# Patient Record
Sex: Female | Born: 1950 | Race: White | Hispanic: No
Health system: Southern US, Community
[De-identification: ages and names within clinical notes are randomized; demographics above are authoritative.]

---

## 2016-01-16 DIAGNOSIS — M5441 Lumbago with sciatica, right side: Secondary | ICD-10-CM | POA: Diagnosis not present

## 2016-01-16 DIAGNOSIS — M5126 Other intervertebral disc displacement, lumbar region: Secondary | ICD-10-CM | POA: Diagnosis not present

## 2016-01-16 DIAGNOSIS — M5117 Intervertebral disc disorders with radiculopathy, lumbosacral region: Secondary | ICD-10-CM | POA: Diagnosis not present

## 2016-01-26 DIAGNOSIS — M5416 Radiculopathy, lumbar region: Secondary | ICD-10-CM | POA: Diagnosis not present

## 2016-01-26 DIAGNOSIS — M461 Sacroiliitis, not elsewhere classified: Secondary | ICD-10-CM | POA: Diagnosis not present

## 2016-01-26 DIAGNOSIS — M5116 Intervertebral disc disorders with radiculopathy, lumbar region: Secondary | ICD-10-CM | POA: Diagnosis not present

## 2016-01-26 DIAGNOSIS — M48062 Spinal stenosis, lumbar region with neurogenic claudication: Secondary | ICD-10-CM | POA: Diagnosis not present

## 2016-01-26 DIAGNOSIS — T7840XA Allergy, unspecified, initial encounter: Secondary | ICD-10-CM | POA: Diagnosis not present

## 2016-01-26 DIAGNOSIS — M48061 Spinal stenosis, lumbar region without neurogenic claudication: Secondary | ICD-10-CM | POA: Diagnosis not present

## 2016-02-04 DIAGNOSIS — F332 Major depressive disorder, recurrent severe without psychotic features: Secondary | ICD-10-CM | POA: Diagnosis not present

## 2016-02-04 DIAGNOSIS — F411 Generalized anxiety disorder: Secondary | ICD-10-CM | POA: Diagnosis not present

## 2016-02-13 DIAGNOSIS — M5441 Lumbago with sciatica, right side: Secondary | ICD-10-CM | POA: Diagnosis not present

## 2016-02-13 DIAGNOSIS — M5116 Intervertebral disc disorders with radiculopathy, lumbar region: Secondary | ICD-10-CM | POA: Diagnosis not present

## 2016-02-13 DIAGNOSIS — M5126 Other intervertebral disc displacement, lumbar region: Secondary | ICD-10-CM | POA: Diagnosis not present

## 2016-03-01 DIAGNOSIS — M461 Sacroiliitis, not elsewhere classified: Secondary | ICD-10-CM | POA: Diagnosis not present

## 2016-03-01 DIAGNOSIS — M5126 Other intervertebral disc displacement, lumbar region: Secondary | ICD-10-CM | POA: Diagnosis not present

## 2016-03-01 DIAGNOSIS — M5416 Radiculopathy, lumbar region: Secondary | ICD-10-CM | POA: Diagnosis not present

## 2016-03-01 DIAGNOSIS — M5116 Intervertebral disc disorders with radiculopathy, lumbar region: Secondary | ICD-10-CM | POA: Diagnosis not present

## 2016-03-11 DIAGNOSIS — M5116 Intervertebral disc disorders with radiculopathy, lumbar region: Secondary | ICD-10-CM | POA: Diagnosis not present

## 2016-03-11 DIAGNOSIS — M4727 Other spondylosis with radiculopathy, lumbosacral region: Secondary | ICD-10-CM | POA: Diagnosis not present

## 2016-03-23 DIAGNOSIS — M5126 Other intervertebral disc displacement, lumbar region: Secondary | ICD-10-CM | POA: Diagnosis not present

## 2016-03-23 DIAGNOSIS — M48061 Spinal stenosis, lumbar region without neurogenic claudication: Secondary | ICD-10-CM | POA: Diagnosis not present

## 2016-03-30 DIAGNOSIS — E559 Vitamin D deficiency, unspecified: Secondary | ICD-10-CM | POA: Diagnosis not present

## 2016-03-30 DIAGNOSIS — M2550 Pain in unspecified joint: Secondary | ICD-10-CM | POA: Diagnosis not present

## 2016-03-30 DIAGNOSIS — E039 Hypothyroidism, unspecified: Secondary | ICD-10-CM | POA: Diagnosis not present

## 2016-03-30 DIAGNOSIS — K900 Celiac disease: Secondary | ICD-10-CM | POA: Diagnosis not present

## 2016-03-30 DIAGNOSIS — M62838 Other muscle spasm: Secondary | ICD-10-CM | POA: Diagnosis not present

## 2016-03-30 DIAGNOSIS — E7800 Pure hypercholesterolemia, unspecified: Secondary | ICD-10-CM | POA: Diagnosis not present

## 2016-03-30 DIAGNOSIS — M0579 Rheumatoid arthritis with rheumatoid factor of multiple sites without organ or systems involvement: Secondary | ICD-10-CM | POA: Diagnosis not present

## 2016-03-30 DIAGNOSIS — Z13820 Encounter for screening for osteoporosis: Secondary | ICD-10-CM | POA: Diagnosis not present

## 2016-03-30 DIAGNOSIS — R7309 Other abnormal glucose: Secondary | ICD-10-CM | POA: Diagnosis not present

## 2016-03-30 DIAGNOSIS — L4059 Other psoriatic arthropathy: Secondary | ICD-10-CM | POA: Diagnosis not present

## 2016-04-07 DIAGNOSIS — M069 Rheumatoid arthritis, unspecified: Secondary | ICD-10-CM | POA: Diagnosis not present

## 2016-04-07 DIAGNOSIS — M48061 Spinal stenosis, lumbar region without neurogenic claudication: Secondary | ICD-10-CM | POA: Diagnosis not present

## 2016-04-07 DIAGNOSIS — Z01818 Encounter for other preprocedural examination: Secondary | ICD-10-CM | POA: Diagnosis not present

## 2016-04-07 DIAGNOSIS — M5136 Other intervertebral disc degeneration, lumbar region: Secondary | ICD-10-CM | POA: Diagnosis not present

## 2016-04-13 DIAGNOSIS — M4306 Spondylolysis, lumbar region: Secondary | ICD-10-CM | POA: Diagnosis not present

## 2016-04-13 DIAGNOSIS — M4807 Spinal stenosis, lumbosacral region: Secondary | ICD-10-CM | POA: Diagnosis not present

## 2016-04-13 DIAGNOSIS — M5126 Other intervertebral disc displacement, lumbar region: Secondary | ICD-10-CM | POA: Diagnosis not present

## 2016-04-26 DIAGNOSIS — M48061 Spinal stenosis, lumbar region without neurogenic claudication: Secondary | ICD-10-CM | POA: Diagnosis present

## 2016-04-26 DIAGNOSIS — F17211 Nicotine dependence, cigarettes, in remission: Secondary | ICD-10-CM | POA: Diagnosis present

## 2016-04-26 DIAGNOSIS — M419 Scoliosis, unspecified: Secondary | ICD-10-CM | POA: Diagnosis not present

## 2016-04-26 DIAGNOSIS — M47816 Spondylosis without myelopathy or radiculopathy, lumbar region: Secondary | ICD-10-CM | POA: Diagnosis present

## 2016-04-26 DIAGNOSIS — M4316 Spondylolisthesis, lumbar region: Secondary | ICD-10-CM | POA: Diagnosis present

## 2016-04-26 DIAGNOSIS — R1033 Periumbilical pain: Secondary | ICD-10-CM | POA: Diagnosis not present

## 2016-04-26 DIAGNOSIS — M810 Age-related osteoporosis without current pathological fracture: Secondary | ICD-10-CM | POA: Diagnosis present

## 2016-04-26 DIAGNOSIS — M532X6 Spinal instabilities, lumbar region: Secondary | ICD-10-CM | POA: Diagnosis not present

## 2016-04-26 DIAGNOSIS — M4326 Fusion of spine, lumbar region: Secondary | ICD-10-CM | POA: Diagnosis not present

## 2016-04-26 DIAGNOSIS — K5900 Constipation, unspecified: Secondary | ICD-10-CM | POA: Diagnosis not present

## 2016-04-26 DIAGNOSIS — M069 Rheumatoid arthritis, unspecified: Secondary | ICD-10-CM | POA: Diagnosis present

## 2016-05-03 DIAGNOSIS — Z9889 Other specified postprocedural states: Secondary | ICD-10-CM | POA: Diagnosis not present

## 2016-05-03 DIAGNOSIS — M545 Low back pain: Secondary | ICD-10-CM | POA: Diagnosis not present

## 2016-05-03 DIAGNOSIS — Z981 Arthrodesis status: Secondary | ICD-10-CM | POA: Diagnosis not present

## 2016-05-05 DIAGNOSIS — M79605 Pain in left leg: Secondary | ICD-10-CM | POA: Diagnosis not present

## 2016-05-05 DIAGNOSIS — M47816 Spondylosis without myelopathy or radiculopathy, lumbar region: Secondary | ICD-10-CM | POA: Diagnosis not present

## 2016-05-05 DIAGNOSIS — Z981 Arthrodesis status: Secondary | ICD-10-CM | POA: Diagnosis not present

## 2016-05-05 DIAGNOSIS — Z4789 Encounter for other orthopedic aftercare: Secondary | ICD-10-CM | POA: Diagnosis not present

## 2016-05-05 DIAGNOSIS — M7989 Other specified soft tissue disorders: Secondary | ICD-10-CM | POA: Diagnosis not present

## 2016-05-05 DIAGNOSIS — M545 Low back pain: Secondary | ICD-10-CM | POA: Diagnosis not present

## 2016-05-05 DIAGNOSIS — M5136 Other intervertebral disc degeneration, lumbar region: Secondary | ICD-10-CM | POA: Diagnosis not present

## 2016-05-05 DIAGNOSIS — R2242 Localized swelling, mass and lump, left lower limb: Secondary | ICD-10-CM | POA: Diagnosis not present

## 2016-05-10 DIAGNOSIS — R21 Rash and other nonspecific skin eruption: Secondary | ICD-10-CM | POA: Diagnosis not present

## 2016-05-10 DIAGNOSIS — R600 Localized edema: Secondary | ICD-10-CM | POA: Diagnosis not present

## 2016-05-11 DIAGNOSIS — M79604 Pain in right leg: Secondary | ICD-10-CM | POA: Diagnosis not present

## 2016-05-11 DIAGNOSIS — Z96653 Presence of artificial knee joint, bilateral: Secondary | ICD-10-CM | POA: Diagnosis not present

## 2016-05-11 DIAGNOSIS — I7789 Other specified disorders of arteries and arterioles: Secondary | ICD-10-CM | POA: Diagnosis not present

## 2016-05-11 DIAGNOSIS — Z981 Arthrodesis status: Secondary | ICD-10-CM | POA: Diagnosis not present

## 2016-05-11 DIAGNOSIS — M79605 Pain in left leg: Secondary | ICD-10-CM | POA: Diagnosis not present

## 2016-05-11 DIAGNOSIS — I83893 Varicose veins of bilateral lower extremities with other complications: Secondary | ICD-10-CM | POA: Diagnosis not present

## 2016-05-24 DIAGNOSIS — F411 Generalized anxiety disorder: Secondary | ICD-10-CM | POA: Diagnosis not present

## 2016-05-24 DIAGNOSIS — F332 Major depressive disorder, recurrent severe without psychotic features: Secondary | ICD-10-CM | POA: Diagnosis not present

## 2016-06-04 DIAGNOSIS — Z981 Arthrodesis status: Secondary | ICD-10-CM | POA: Diagnosis not present

## 2016-06-22 DIAGNOSIS — M2550 Pain in unspecified joint: Secondary | ICD-10-CM | POA: Diagnosis not present

## 2016-06-22 DIAGNOSIS — M359 Systemic involvement of connective tissue, unspecified: Secondary | ICD-10-CM | POA: Diagnosis not present

## 2016-06-22 DIAGNOSIS — Z79899 Other long term (current) drug therapy: Secondary | ICD-10-CM | POA: Diagnosis not present

## 2016-06-22 DIAGNOSIS — D688 Other specified coagulation defects: Secondary | ICD-10-CM | POA: Diagnosis not present

## 2016-06-22 DIAGNOSIS — M0579 Rheumatoid arthritis with rheumatoid factor of multiple sites without organ or systems involvement: Secondary | ICD-10-CM | POA: Diagnosis not present

## 2016-06-22 DIAGNOSIS — E039 Hypothyroidism, unspecified: Secondary | ICD-10-CM | POA: Diagnosis not present

## 2016-06-22 DIAGNOSIS — E559 Vitamin D deficiency, unspecified: Secondary | ICD-10-CM | POA: Diagnosis not present

## 2016-06-23 DIAGNOSIS — M5136 Other intervertebral disc degeneration, lumbar region: Secondary | ICD-10-CM | POA: Diagnosis not present

## 2016-06-23 DIAGNOSIS — Z981 Arthrodesis status: Secondary | ICD-10-CM | POA: Diagnosis not present

## 2016-06-23 DIAGNOSIS — M47816 Spondylosis without myelopathy or radiculopathy, lumbar region: Secondary | ICD-10-CM | POA: Diagnosis not present

## 2016-06-29 DIAGNOSIS — M5442 Lumbago with sciatica, left side: Secondary | ICD-10-CM | POA: Diagnosis not present

## 2016-06-29 DIAGNOSIS — Z4789 Encounter for other orthopedic aftercare: Secondary | ICD-10-CM | POA: Diagnosis not present

## 2016-07-01 DIAGNOSIS — Z4789 Encounter for other orthopedic aftercare: Secondary | ICD-10-CM | POA: Diagnosis not present

## 2016-07-01 DIAGNOSIS — M5442 Lumbago with sciatica, left side: Secondary | ICD-10-CM | POA: Diagnosis not present

## 2016-07-05 DIAGNOSIS — Z4789 Encounter for other orthopedic aftercare: Secondary | ICD-10-CM | POA: Diagnosis not present

## 2016-07-05 DIAGNOSIS — M5442 Lumbago with sciatica, left side: Secondary | ICD-10-CM | POA: Diagnosis not present

## 2016-07-08 DIAGNOSIS — Z4789 Encounter for other orthopedic aftercare: Secondary | ICD-10-CM | POA: Diagnosis not present

## 2016-07-08 DIAGNOSIS — M5442 Lumbago with sciatica, left side: Secondary | ICD-10-CM | POA: Diagnosis not present

## 2016-07-12 DIAGNOSIS — F332 Major depressive disorder, recurrent severe without psychotic features: Secondary | ICD-10-CM | POA: Diagnosis not present

## 2016-07-12 DIAGNOSIS — F411 Generalized anxiety disorder: Secondary | ICD-10-CM | POA: Diagnosis not present

## 2016-07-13 DIAGNOSIS — M5442 Lumbago with sciatica, left side: Secondary | ICD-10-CM | POA: Diagnosis not present

## 2016-07-13 DIAGNOSIS — Z4789 Encounter for other orthopedic aftercare: Secondary | ICD-10-CM | POA: Diagnosis not present

## 2016-07-16 DIAGNOSIS — M5442 Lumbago with sciatica, left side: Secondary | ICD-10-CM | POA: Diagnosis not present

## 2016-07-16 DIAGNOSIS — Z4789 Encounter for other orthopedic aftercare: Secondary | ICD-10-CM | POA: Diagnosis not present

## 2016-07-20 DIAGNOSIS — M5442 Lumbago with sciatica, left side: Secondary | ICD-10-CM | POA: Diagnosis not present

## 2016-07-20 DIAGNOSIS — Z4789 Encounter for other orthopedic aftercare: Secondary | ICD-10-CM | POA: Diagnosis not present

## 2016-07-21 DIAGNOSIS — M5442 Lumbago with sciatica, left side: Secondary | ICD-10-CM | POA: Diagnosis not present

## 2016-07-21 DIAGNOSIS — Z4789 Encounter for other orthopedic aftercare: Secondary | ICD-10-CM | POA: Diagnosis not present

## 2016-08-02 DIAGNOSIS — M545 Low back pain: Secondary | ICD-10-CM | POA: Diagnosis not present

## 2016-08-02 DIAGNOSIS — M4326 Fusion of spine, lumbar region: Secondary | ICD-10-CM | POA: Diagnosis not present

## 2016-08-02 DIAGNOSIS — Z4789 Encounter for other orthopedic aftercare: Secondary | ICD-10-CM | POA: Diagnosis not present

## 2016-08-02 DIAGNOSIS — Z981 Arthrodesis status: Secondary | ICD-10-CM | POA: Diagnosis not present

## 2016-08-02 DIAGNOSIS — M5442 Lumbago with sciatica, left side: Secondary | ICD-10-CM | POA: Diagnosis not present

## 2016-08-04 DIAGNOSIS — M5442 Lumbago with sciatica, left side: Secondary | ICD-10-CM | POA: Diagnosis not present

## 2016-08-04 DIAGNOSIS — Z4789 Encounter for other orthopedic aftercare: Secondary | ICD-10-CM | POA: Diagnosis not present

## 2016-08-10 DIAGNOSIS — Z4789 Encounter for other orthopedic aftercare: Secondary | ICD-10-CM | POA: Diagnosis not present

## 2016-08-10 DIAGNOSIS — M5442 Lumbago with sciatica, left side: Secondary | ICD-10-CM | POA: Diagnosis not present

## 2016-08-12 DIAGNOSIS — Z4789 Encounter for other orthopedic aftercare: Secondary | ICD-10-CM | POA: Diagnosis not present

## 2016-08-12 DIAGNOSIS — M5442 Lumbago with sciatica, left side: Secondary | ICD-10-CM | POA: Diagnosis not present

## 2016-08-18 DIAGNOSIS — M5442 Lumbago with sciatica, left side: Secondary | ICD-10-CM | POA: Diagnosis not present

## 2016-08-18 DIAGNOSIS — Z4789 Encounter for other orthopedic aftercare: Secondary | ICD-10-CM | POA: Diagnosis not present

## 2016-08-19 DIAGNOSIS — M5442 Lumbago with sciatica, left side: Secondary | ICD-10-CM | POA: Diagnosis not present

## 2016-08-19 DIAGNOSIS — Z4789 Encounter for other orthopedic aftercare: Secondary | ICD-10-CM | POA: Diagnosis not present

## 2016-09-02 DIAGNOSIS — M5442 Lumbago with sciatica, left side: Secondary | ICD-10-CM | POA: Diagnosis not present

## 2016-09-02 DIAGNOSIS — Z4789 Encounter for other orthopedic aftercare: Secondary | ICD-10-CM | POA: Diagnosis not present

## 2016-09-08 DIAGNOSIS — F411 Generalized anxiety disorder: Secondary | ICD-10-CM | POA: Diagnosis not present

## 2016-09-08 DIAGNOSIS — F332 Major depressive disorder, recurrent severe without psychotic features: Secondary | ICD-10-CM | POA: Diagnosis not present

## 2016-09-09 DIAGNOSIS — M5442 Lumbago with sciatica, left side: Secondary | ICD-10-CM | POA: Diagnosis not present

## 2016-09-09 DIAGNOSIS — Z4789 Encounter for other orthopedic aftercare: Secondary | ICD-10-CM | POA: Diagnosis not present

## 2016-09-15 DIAGNOSIS — Z4789 Encounter for other orthopedic aftercare: Secondary | ICD-10-CM | POA: Diagnosis not present

## 2016-09-15 DIAGNOSIS — M5442 Lumbago with sciatica, left side: Secondary | ICD-10-CM | POA: Diagnosis not present

## 2016-09-16 DIAGNOSIS — Z4789 Encounter for other orthopedic aftercare: Secondary | ICD-10-CM | POA: Diagnosis not present

## 2016-09-16 DIAGNOSIS — M5442 Lumbago with sciatica, left side: Secondary | ICD-10-CM | POA: Diagnosis not present

## 2016-09-20 DIAGNOSIS — Z4789 Encounter for other orthopedic aftercare: Secondary | ICD-10-CM | POA: Diagnosis not present

## 2016-09-20 DIAGNOSIS — M5442 Lumbago with sciatica, left side: Secondary | ICD-10-CM | POA: Diagnosis not present

## 2016-09-28 DIAGNOSIS — M5442 Lumbago with sciatica, left side: Secondary | ICD-10-CM | POA: Diagnosis not present

## 2016-09-28 DIAGNOSIS — Z4789 Encounter for other orthopedic aftercare: Secondary | ICD-10-CM | POA: Diagnosis not present

## 2016-11-01 DIAGNOSIS — B353 Tinea pedis: Secondary | ICD-10-CM | POA: Diagnosis not present

## 2016-11-01 DIAGNOSIS — M2041 Other hammer toe(s) (acquired), right foot: Secondary | ICD-10-CM | POA: Diagnosis not present

## 2016-11-01 DIAGNOSIS — M2042 Other hammer toe(s) (acquired), left foot: Secondary | ICD-10-CM | POA: Diagnosis not present

## 2016-11-01 DIAGNOSIS — M2170 Unequal limb length (acquired), unspecified site: Secondary | ICD-10-CM | POA: Diagnosis not present

## 2016-11-01 DIAGNOSIS — M25551 Pain in right hip: Secondary | ICD-10-CM | POA: Diagnosis not present

## 2016-11-02 DIAGNOSIS — M47816 Spondylosis without myelopathy or radiculopathy, lumbar region: Secondary | ICD-10-CM | POA: Diagnosis not present

## 2016-11-02 DIAGNOSIS — M4316 Spondylolisthesis, lumbar region: Secondary | ICD-10-CM | POA: Diagnosis not present

## 2016-11-02 DIAGNOSIS — M5126 Other intervertebral disc displacement, lumbar region: Secondary | ICD-10-CM | POA: Diagnosis not present

## 2016-11-02 DIAGNOSIS — M5136 Other intervertebral disc degeneration, lumbar region: Secondary | ICD-10-CM | POA: Diagnosis not present

## 2016-11-02 DIAGNOSIS — Z981 Arthrodesis status: Secondary | ICD-10-CM | POA: Diagnosis not present

## 2016-11-03 DIAGNOSIS — F411 Generalized anxiety disorder: Secondary | ICD-10-CM | POA: Diagnosis not present

## 2016-11-03 DIAGNOSIS — F332 Major depressive disorder, recurrent severe without psychotic features: Secondary | ICD-10-CM | POA: Diagnosis not present

## 2016-11-10 DIAGNOSIS — M25559 Pain in unspecified hip: Secondary | ICD-10-CM | POA: Diagnosis not present

## 2016-11-10 DIAGNOSIS — M549 Dorsalgia, unspecified: Secondary | ICD-10-CM | POA: Diagnosis not present

## 2016-11-10 DIAGNOSIS — M7071 Other bursitis of hip, right hip: Secondary | ICD-10-CM | POA: Diagnosis not present

## 2016-11-15 DIAGNOSIS — M25559 Pain in unspecified hip: Secondary | ICD-10-CM | POA: Diagnosis not present

## 2016-11-18 DIAGNOSIS — G3184 Mild cognitive impairment, so stated: Secondary | ICD-10-CM | POA: Diagnosis not present

## 2016-11-29 DIAGNOSIS — M5441 Lumbago with sciatica, right side: Secondary | ICD-10-CM | POA: Diagnosis not present

## 2016-11-30 DIAGNOSIS — M5441 Lumbago with sciatica, right side: Secondary | ICD-10-CM | POA: Diagnosis not present

## 2016-12-01 DIAGNOSIS — M25559 Pain in unspecified hip: Secondary | ICD-10-CM | POA: Diagnosis not present

## 2016-12-01 DIAGNOSIS — M549 Dorsalgia, unspecified: Secondary | ICD-10-CM | POA: Diagnosis not present

## 2016-12-01 DIAGNOSIS — M546 Pain in thoracic spine: Secondary | ICD-10-CM | POA: Diagnosis not present

## 2016-12-01 DIAGNOSIS — M533 Sacrococcygeal disorders, not elsewhere classified: Secondary | ICD-10-CM | POA: Diagnosis not present

## 2016-12-06 DIAGNOSIS — M25559 Pain in unspecified hip: Secondary | ICD-10-CM | POA: Diagnosis not present

## 2016-12-06 DIAGNOSIS — M533 Sacrococcygeal disorders, not elsewhere classified: Secondary | ICD-10-CM | POA: Diagnosis not present

## 2016-12-08 DIAGNOSIS — M5441 Lumbago with sciatica, right side: Secondary | ICD-10-CM | POA: Diagnosis not present

## 2016-12-13 DIAGNOSIS — M5441 Lumbago with sciatica, right side: Secondary | ICD-10-CM | POA: Diagnosis not present

## 2016-12-23 DIAGNOSIS — F411 Generalized anxiety disorder: Secondary | ICD-10-CM | POA: Diagnosis not present

## 2016-12-23 DIAGNOSIS — F332 Major depressive disorder, recurrent severe without psychotic features: Secondary | ICD-10-CM | POA: Diagnosis not present

## 2017-01-05 DIAGNOSIS — E039 Hypothyroidism, unspecified: Secondary | ICD-10-CM | POA: Diagnosis not present

## 2017-01-05 DIAGNOSIS — M546 Pain in thoracic spine: Secondary | ICD-10-CM | POA: Diagnosis not present

## 2017-01-05 DIAGNOSIS — E531 Pyridoxine deficiency: Secondary | ICD-10-CM | POA: Diagnosis not present

## 2017-01-05 DIAGNOSIS — M7061 Trochanteric bursitis, right hip: Secondary | ICD-10-CM | POA: Diagnosis not present

## 2017-01-05 DIAGNOSIS — E6609 Other obesity due to excess calories: Secondary | ICD-10-CM | POA: Diagnosis not present

## 2017-01-05 DIAGNOSIS — E569 Vitamin deficiency, unspecified: Secondary | ICD-10-CM | POA: Diagnosis not present

## 2017-01-05 DIAGNOSIS — E559 Vitamin D deficiency, unspecified: Secondary | ICD-10-CM | POA: Diagnosis not present

## 2017-01-05 DIAGNOSIS — I2510 Atherosclerotic heart disease of native coronary artery without angina pectoris: Secondary | ICD-10-CM | POA: Diagnosis not present

## 2017-01-05 DIAGNOSIS — E538 Deficiency of other specified B group vitamins: Secondary | ICD-10-CM | POA: Diagnosis not present

## 2017-01-05 DIAGNOSIS — M533 Sacrococcygeal disorders, not elsewhere classified: Secondary | ICD-10-CM | POA: Diagnosis not present

## 2017-01-05 DIAGNOSIS — M2550 Pain in unspecified joint: Secondary | ICD-10-CM | POA: Diagnosis not present

## 2017-01-05 DIAGNOSIS — M25559 Pain in unspecified hip: Secondary | ICD-10-CM | POA: Diagnosis not present

## 2017-01-06 DIAGNOSIS — Z79899 Other long term (current) drug therapy: Secondary | ICD-10-CM | POA: Diagnosis not present

## 2017-01-06 DIAGNOSIS — M533 Sacrococcygeal disorders, not elsewhere classified: Secondary | ICD-10-CM | POA: Diagnosis not present

## 2017-01-06 DIAGNOSIS — M461 Sacroiliitis, not elsewhere classified: Secondary | ICD-10-CM | POA: Diagnosis not present

## 2017-01-06 DIAGNOSIS — M7061 Trochanteric bursitis, right hip: Secondary | ICD-10-CM | POA: Diagnosis not present

## 2017-01-06 DIAGNOSIS — M25559 Pain in unspecified hip: Secondary | ICD-10-CM | POA: Diagnosis not present

## 2017-01-17 DIAGNOSIS — Z13820 Encounter for screening for osteoporosis: Secondary | ICD-10-CM | POA: Diagnosis not present

## 2017-01-17 DIAGNOSIS — M810 Age-related osteoporosis without current pathological fracture: Secondary | ICD-10-CM | POA: Diagnosis not present

## 2017-01-17 DIAGNOSIS — E559 Vitamin D deficiency, unspecified: Secondary | ICD-10-CM | POA: Diagnosis not present

## 2017-01-17 DIAGNOSIS — L4059 Other psoriatic arthropathy: Secondary | ICD-10-CM | POA: Diagnosis not present

## 2017-01-17 DIAGNOSIS — M62838 Other muscle spasm: Secondary | ICD-10-CM | POA: Diagnosis not present

## 2017-01-17 DIAGNOSIS — E7800 Pure hypercholesterolemia, unspecified: Secondary | ICD-10-CM | POA: Diagnosis not present

## 2017-01-17 DIAGNOSIS — R7309 Other abnormal glucose: Secondary | ICD-10-CM | POA: Diagnosis not present

## 2017-01-17 DIAGNOSIS — K900 Celiac disease: Secondary | ICD-10-CM | POA: Diagnosis not present

## 2017-01-17 DIAGNOSIS — E539 Vitamin B deficiency, unspecified: Secondary | ICD-10-CM | POA: Diagnosis not present

## 2017-01-19 DIAGNOSIS — M549 Dorsalgia, unspecified: Secondary | ICD-10-CM | POA: Diagnosis not present

## 2017-01-19 DIAGNOSIS — Z981 Arthrodesis status: Secondary | ICD-10-CM | POA: Diagnosis not present

## 2017-01-19 DIAGNOSIS — M461 Sacroiliitis, not elsewhere classified: Secondary | ICD-10-CM | POA: Diagnosis not present

## 2017-01-19 DIAGNOSIS — M546 Pain in thoracic spine: Secondary | ICD-10-CM | POA: Diagnosis not present

## 2017-01-19 DIAGNOSIS — M25559 Pain in unspecified hip: Secondary | ICD-10-CM | POA: Diagnosis not present

## 2017-01-20 DIAGNOSIS — M4326 Fusion of spine, lumbar region: Secondary | ICD-10-CM | POA: Diagnosis not present

## 2017-01-20 DIAGNOSIS — M4696 Unspecified inflammatory spondylopathy, lumbar region: Secondary | ICD-10-CM | POA: Diagnosis not present

## 2017-01-20 DIAGNOSIS — M545 Low back pain: Secondary | ICD-10-CM | POA: Diagnosis not present

## 2017-01-20 DIAGNOSIS — M5126 Other intervertebral disc displacement, lumbar region: Secondary | ICD-10-CM | POA: Diagnosis not present

## 2017-01-28 DIAGNOSIS — M47816 Spondylosis without myelopathy or radiculopathy, lumbar region: Secondary | ICD-10-CM | POA: Diagnosis not present

## 2017-01-28 DIAGNOSIS — M47817 Spondylosis without myelopathy or radiculopathy, lumbosacral region: Secondary | ICD-10-CM | POA: Diagnosis not present

## 2017-01-28 DIAGNOSIS — M533 Sacrococcygeal disorders, not elsewhere classified: Secondary | ICD-10-CM | POA: Diagnosis not present

## 2017-01-28 DIAGNOSIS — M545 Low back pain: Secondary | ICD-10-CM | POA: Diagnosis not present

## 2017-01-28 DIAGNOSIS — M5134 Other intervertebral disc degeneration, thoracic region: Secondary | ICD-10-CM | POA: Diagnosis not present

## 2017-01-28 DIAGNOSIS — M549 Dorsalgia, unspecified: Secondary | ICD-10-CM | POA: Diagnosis not present

## 2017-01-28 DIAGNOSIS — Z981 Arthrodesis status: Secondary | ICD-10-CM | POA: Diagnosis not present

## 2017-01-28 DIAGNOSIS — M7071 Other bursitis of hip, right hip: Secondary | ICD-10-CM | POA: Diagnosis not present

## 2017-01-28 DIAGNOSIS — M546 Pain in thoracic spine: Secondary | ICD-10-CM | POA: Diagnosis not present

## 2017-01-28 DIAGNOSIS — Z9889 Other specified postprocedural states: Secondary | ICD-10-CM | POA: Diagnosis not present

## 2017-02-02 DIAGNOSIS — M549 Dorsalgia, unspecified: Secondary | ICD-10-CM | POA: Diagnosis not present

## 2017-02-02 DIAGNOSIS — M546 Pain in thoracic spine: Secondary | ICD-10-CM | POA: Diagnosis not present

## 2017-02-02 DIAGNOSIS — Z981 Arthrodesis status: Secondary | ICD-10-CM | POA: Diagnosis not present

## 2017-03-11 DIAGNOSIS — F411 Generalized anxiety disorder: Secondary | ICD-10-CM | POA: Diagnosis not present

## 2017-03-11 DIAGNOSIS — F332 Major depressive disorder, recurrent severe without psychotic features: Secondary | ICD-10-CM | POA: Diagnosis not present

## 2017-04-05 DIAGNOSIS — H00011 Hordeolum externum right upper eyelid: Secondary | ICD-10-CM | POA: Diagnosis not present

## 2017-04-15 DIAGNOSIS — M0570 Rheumatoid arthritis with rheumatoid factor of unspecified site without organ or systems involvement: Secondary | ICD-10-CM | POA: Diagnosis not present

## 2017-04-15 DIAGNOSIS — H04123 Dry eye syndrome of bilateral lacrimal glands: Secondary | ICD-10-CM | POA: Diagnosis not present

## 2017-04-15 DIAGNOSIS — Z961 Presence of intraocular lens: Secondary | ICD-10-CM | POA: Diagnosis not present

## 2017-04-15 DIAGNOSIS — D23111 Other benign neoplasm of skin of right upper eyelid, including canthus: Secondary | ICD-10-CM | POA: Diagnosis not present

## 2017-04-15 DIAGNOSIS — H00011 Hordeolum externum right upper eyelid: Secondary | ICD-10-CM | POA: Diagnosis not present

## 2017-04-25 DIAGNOSIS — H0011 Chalazion right upper eyelid: Secondary | ICD-10-CM | POA: Diagnosis not present

## 2017-04-25 DIAGNOSIS — H00022 Hordeolum internum right lower eyelid: Secondary | ICD-10-CM | POA: Diagnosis not present

## 2017-05-11 DIAGNOSIS — F332 Major depressive disorder, recurrent severe without psychotic features: Secondary | ICD-10-CM | POA: Diagnosis not present

## 2017-05-11 DIAGNOSIS — F411 Generalized anxiety disorder: Secondary | ICD-10-CM | POA: Diagnosis not present

## 2017-05-18 DIAGNOSIS — H04123 Dry eye syndrome of bilateral lacrimal glands: Secondary | ICD-10-CM | POA: Diagnosis not present

## 2017-05-18 DIAGNOSIS — M0570 Rheumatoid arthritis with rheumatoid factor of unspecified site without organ or systems involvement: Secondary | ICD-10-CM | POA: Diagnosis not present

## 2017-05-18 DIAGNOSIS — Z961 Presence of intraocular lens: Secondary | ICD-10-CM | POA: Diagnosis not present

## 2017-05-18 DIAGNOSIS — H524 Presbyopia: Secondary | ICD-10-CM | POA: Diagnosis not present

## 2017-05-18 DIAGNOSIS — H00022 Hordeolum internum right lower eyelid: Secondary | ICD-10-CM | POA: Diagnosis not present

## 2017-05-18 DIAGNOSIS — H52223 Regular astigmatism, bilateral: Secondary | ICD-10-CM | POA: Diagnosis not present

## 2017-05-23 DIAGNOSIS — Z961 Presence of intraocular lens: Secondary | ICD-10-CM | POA: Diagnosis not present

## 2017-05-23 DIAGNOSIS — H04123 Dry eye syndrome of bilateral lacrimal glands: Secondary | ICD-10-CM | POA: Diagnosis not present

## 2017-05-23 DIAGNOSIS — M0570 Rheumatoid arthritis with rheumatoid factor of unspecified site without organ or systems involvement: Secondary | ICD-10-CM | POA: Diagnosis not present

## 2017-05-23 DIAGNOSIS — H00022 Hordeolum internum right lower eyelid: Secondary | ICD-10-CM | POA: Diagnosis not present

## 2017-05-23 DIAGNOSIS — H0011 Chalazion right upper eyelid: Secondary | ICD-10-CM | POA: Diagnosis not present

## 2017-05-23 DIAGNOSIS — D23111 Other benign neoplasm of skin of right upper eyelid, including canthus: Secondary | ICD-10-CM | POA: Diagnosis not present

## 2017-06-13 DIAGNOSIS — H00022 Hordeolum internum right lower eyelid: Secondary | ICD-10-CM | POA: Diagnosis not present

## 2017-06-13 DIAGNOSIS — D23111 Other benign neoplasm of skin of right upper eyelid, including canthus: Secondary | ICD-10-CM | POA: Diagnosis not present

## 2017-06-13 DIAGNOSIS — H04123 Dry eye syndrome of bilateral lacrimal glands: Secondary | ICD-10-CM | POA: Diagnosis not present

## 2017-06-13 DIAGNOSIS — M0570 Rheumatoid arthritis with rheumatoid factor of unspecified site without organ or systems involvement: Secondary | ICD-10-CM | POA: Diagnosis not present

## 2017-07-06 DIAGNOSIS — F332 Major depressive disorder, recurrent severe without psychotic features: Secondary | ICD-10-CM | POA: Diagnosis not present

## 2017-07-06 DIAGNOSIS — F411 Generalized anxiety disorder: Secondary | ICD-10-CM | POA: Diagnosis not present

## 2017-10-05 DIAGNOSIS — F411 Generalized anxiety disorder: Secondary | ICD-10-CM | POA: Diagnosis not present

## 2017-10-05 DIAGNOSIS — F332 Major depressive disorder, recurrent severe without psychotic features: Secondary | ICD-10-CM | POA: Diagnosis not present

## 2017-11-16 DIAGNOSIS — L989 Disorder of the skin and subcutaneous tissue, unspecified: Secondary | ICD-10-CM | POA: Diagnosis not present

## 2017-11-16 DIAGNOSIS — M069 Rheumatoid arthritis, unspecified: Secondary | ICD-10-CM | POA: Diagnosis not present

## 2017-11-16 DIAGNOSIS — Z23 Encounter for immunization: Secondary | ICD-10-CM | POA: Diagnosis not present

## 2017-11-16 DIAGNOSIS — G8929 Other chronic pain: Secondary | ICD-10-CM | POA: Diagnosis not present

## 2017-11-16 DIAGNOSIS — M62838 Other muscle spasm: Secondary | ICD-10-CM | POA: Diagnosis not present

## 2017-11-16 DIAGNOSIS — Z13220 Encounter for screening for lipoid disorders: Secondary | ICD-10-CM | POA: Diagnosis not present

## 2017-11-16 DIAGNOSIS — M170 Bilateral primary osteoarthritis of knee: Secondary | ICD-10-CM | POA: Diagnosis not present

## 2017-11-16 DIAGNOSIS — Z136 Encounter for screening for cardiovascular disorders: Secondary | ICD-10-CM | POA: Diagnosis not present

## 2017-12-23 DIAGNOSIS — F332 Major depressive disorder, recurrent severe without psychotic features: Secondary | ICD-10-CM | POA: Diagnosis not present

## 2017-12-23 DIAGNOSIS — F411 Generalized anxiety disorder: Secondary | ICD-10-CM | POA: Diagnosis not present

## 2018-01-24 DIAGNOSIS — M19042 Primary osteoarthritis, left hand: Secondary | ICD-10-CM | POA: Diagnosis not present

## 2018-01-24 DIAGNOSIS — M19041 Primary osteoarthritis, right hand: Secondary | ICD-10-CM | POA: Diagnosis not present

## 2018-03-02 DIAGNOSIS — D485 Neoplasm of uncertain behavior of skin: Secondary | ICD-10-CM | POA: Diagnosis not present

## 2018-03-02 DIAGNOSIS — D1801 Hemangioma of skin and subcutaneous tissue: Secondary | ICD-10-CM | POA: Diagnosis not present

## 2018-03-02 DIAGNOSIS — L430 Hypertrophic lichen planus: Secondary | ICD-10-CM | POA: Diagnosis not present

## 2018-03-02 DIAGNOSIS — L821 Other seborrheic keratosis: Secondary | ICD-10-CM | POA: Diagnosis not present

## 2018-03-02 DIAGNOSIS — L814 Other melanin hyperpigmentation: Secondary | ICD-10-CM | POA: Diagnosis not present

## 2018-03-02 DIAGNOSIS — D225 Melanocytic nevi of trunk: Secondary | ICD-10-CM | POA: Diagnosis not present

## 2018-03-03 DIAGNOSIS — M150 Primary generalized (osteo)arthritis: Secondary | ICD-10-CM | POA: Diagnosis not present

## 2018-03-03 DIAGNOSIS — Z6836 Body mass index (BMI) 36.0-36.9, adult: Secondary | ICD-10-CM | POA: Diagnosis not present

## 2018-03-03 DIAGNOSIS — M0589 Other rheumatoid arthritis with rheumatoid factor of multiple sites: Secondary | ICD-10-CM | POA: Diagnosis not present

## 2018-03-03 DIAGNOSIS — E669 Obesity, unspecified: Secondary | ICD-10-CM | POA: Diagnosis not present

## 2018-03-03 DIAGNOSIS — M545 Low back pain: Secondary | ICD-10-CM | POA: Diagnosis not present

## 2018-03-03 DIAGNOSIS — R5383 Other fatigue: Secondary | ICD-10-CM | POA: Diagnosis not present

## 2018-03-03 DIAGNOSIS — L4050 Arthropathic psoriasis, unspecified: Secondary | ICD-10-CM | POA: Diagnosis not present

## 2018-03-27 DIAGNOSIS — F411 Generalized anxiety disorder: Secondary | ICD-10-CM | POA: Diagnosis not present

## 2018-03-27 DIAGNOSIS — F332 Major depressive disorder, recurrent severe without psychotic features: Secondary | ICD-10-CM | POA: Diagnosis not present

## 2018-04-04 DIAGNOSIS — E7800 Pure hypercholesterolemia, unspecified: Secondary | ICD-10-CM | POA: Diagnosis not present

## 2018-04-04 DIAGNOSIS — M069 Rheumatoid arthritis, unspecified: Secondary | ICD-10-CM | POA: Diagnosis not present

## 2018-04-04 DIAGNOSIS — G8929 Other chronic pain: Secondary | ICD-10-CM | POA: Diagnosis not present

## 2018-04-04 DIAGNOSIS — M545 Low back pain: Secondary | ICD-10-CM | POA: Diagnosis not present

## 2018-04-04 DIAGNOSIS — M170 Bilateral primary osteoarthritis of knee: Secondary | ICD-10-CM | POA: Diagnosis not present

## 2018-05-02 DIAGNOSIS — M545 Low back pain: Secondary | ICD-10-CM | POA: Diagnosis not present

## 2018-05-02 DIAGNOSIS — L4050 Arthropathic psoriasis, unspecified: Secondary | ICD-10-CM | POA: Diagnosis not present

## 2018-05-02 DIAGNOSIS — M0589 Other rheumatoid arthritis with rheumatoid factor of multiple sites: Secondary | ICD-10-CM | POA: Diagnosis not present

## 2018-05-02 DIAGNOSIS — Z79899 Other long term (current) drug therapy: Secondary | ICD-10-CM | POA: Diagnosis not present

## 2018-05-02 DIAGNOSIS — M150 Primary generalized (osteo)arthritis: Secondary | ICD-10-CM | POA: Diagnosis not present

## 2018-09-23 DIAGNOSIS — Z23 Encounter for immunization: Secondary | ICD-10-CM | POA: Diagnosis not present

## 2018-11-17 DIAGNOSIS — M62830 Muscle spasm of back: Secondary | ICD-10-CM | POA: Diagnosis not present

## 2018-11-17 DIAGNOSIS — G8929 Other chronic pain: Secondary | ICD-10-CM | POA: Diagnosis not present

## 2018-11-17 DIAGNOSIS — M069 Rheumatoid arthritis, unspecified: Secondary | ICD-10-CM | POA: Diagnosis not present

## 2018-11-17 DIAGNOSIS — M170 Bilateral primary osteoarthritis of knee: Secondary | ICD-10-CM | POA: Diagnosis not present

## 2018-11-17 DIAGNOSIS — F5101 Primary insomnia: Secondary | ICD-10-CM | POA: Diagnosis not present

## 2019-05-23 DIAGNOSIS — F5101 Primary insomnia: Secondary | ICD-10-CM | POA: Diagnosis not present

## 2019-05-23 DIAGNOSIS — M170 Bilateral primary osteoarthritis of knee: Secondary | ICD-10-CM | POA: Diagnosis not present

## 2019-05-23 DIAGNOSIS — G8929 Other chronic pain: Secondary | ICD-10-CM | POA: Diagnosis not present

## 2019-05-23 DIAGNOSIS — M069 Rheumatoid arthritis, unspecified: Secondary | ICD-10-CM | POA: Diagnosis not present

## 2019-05-23 DIAGNOSIS — Z0001 Encounter for general adult medical examination with abnormal findings: Secondary | ICD-10-CM | POA: Diagnosis not present

## 2019-05-23 DIAGNOSIS — E2839 Other primary ovarian failure: Secondary | ICD-10-CM | POA: Diagnosis not present

## 2019-05-23 DIAGNOSIS — E7800 Pure hypercholesterolemia, unspecified: Secondary | ICD-10-CM | POA: Diagnosis not present

## 2019-05-23 DIAGNOSIS — Z79899 Other long term (current) drug therapy: Secondary | ICD-10-CM | POA: Diagnosis not present

## 2019-05-30 ENCOUNTER — Other Ambulatory Visit: Payer: Self-pay | Admitting: Family Medicine

## 2019-05-30 DIAGNOSIS — E2839 Other primary ovarian failure: Secondary | ICD-10-CM

## 2019-05-30 DIAGNOSIS — Z1231 Encounter for screening mammogram for malignant neoplasm of breast: Secondary | ICD-10-CM

## 2019-06-26 DIAGNOSIS — M154 Erosive (osteo)arthritis: Secondary | ICD-10-CM | POA: Diagnosis not present

## 2019-06-26 DIAGNOSIS — M2170 Unequal limb length (acquired), unspecified site: Secondary | ICD-10-CM | POA: Diagnosis not present

## 2019-06-26 DIAGNOSIS — M65871 Other synovitis and tenosynovitis, right ankle and foot: Secondary | ICD-10-CM | POA: Diagnosis not present

## 2019-08-13 DIAGNOSIS — M542 Cervicalgia: Secondary | ICD-10-CM | POA: Diagnosis not present

## 2019-08-13 DIAGNOSIS — M79641 Pain in right hand: Secondary | ICD-10-CM | POA: Diagnosis not present

## 2019-08-13 DIAGNOSIS — M545 Low back pain: Secondary | ICD-10-CM | POA: Diagnosis not present

## 2019-08-24 DIAGNOSIS — M5412 Radiculopathy, cervical region: Secondary | ICD-10-CM | POA: Diagnosis not present

## 2019-08-24 DIAGNOSIS — M25641 Stiffness of right hand, not elsewhere classified: Secondary | ICD-10-CM | POA: Diagnosis not present

## 2019-08-27 DIAGNOSIS — M5412 Radiculopathy, cervical region: Secondary | ICD-10-CM | POA: Diagnosis not present

## 2019-08-27 DIAGNOSIS — M25641 Stiffness of right hand, not elsewhere classified: Secondary | ICD-10-CM | POA: Diagnosis not present

## 2019-09-05 DIAGNOSIS — M25641 Stiffness of right hand, not elsewhere classified: Secondary | ICD-10-CM | POA: Diagnosis not present

## 2019-09-05 DIAGNOSIS — M5412 Radiculopathy, cervical region: Secondary | ICD-10-CM | POA: Diagnosis not present

## 2019-09-07 DIAGNOSIS — M25641 Stiffness of right hand, not elsewhere classified: Secondary | ICD-10-CM | POA: Diagnosis not present

## 2019-09-07 DIAGNOSIS — M5412 Radiculopathy, cervical region: Secondary | ICD-10-CM | POA: Diagnosis not present

## 2019-09-18 DIAGNOSIS — M5412 Radiculopathy, cervical region: Secondary | ICD-10-CM | POA: Diagnosis not present

## 2019-09-18 DIAGNOSIS — M25641 Stiffness of right hand, not elsewhere classified: Secondary | ICD-10-CM | POA: Diagnosis not present

## 2019-09-20 DIAGNOSIS — M25641 Stiffness of right hand, not elsewhere classified: Secondary | ICD-10-CM | POA: Diagnosis not present

## 2019-09-20 DIAGNOSIS — M5412 Radiculopathy, cervical region: Secondary | ICD-10-CM | POA: Diagnosis not present

## 2019-09-24 DIAGNOSIS — M5412 Radiculopathy, cervical region: Secondary | ICD-10-CM | POA: Diagnosis not present

## 2019-09-24 DIAGNOSIS — M25641 Stiffness of right hand, not elsewhere classified: Secondary | ICD-10-CM | POA: Diagnosis not present

## 2019-09-26 DIAGNOSIS — M5412 Radiculopathy, cervical region: Secondary | ICD-10-CM | POA: Diagnosis not present

## 2019-09-26 DIAGNOSIS — M25641 Stiffness of right hand, not elsewhere classified: Secondary | ICD-10-CM | POA: Diagnosis not present

## 2019-10-04 DIAGNOSIS — M545 Low back pain: Secondary | ICD-10-CM | POA: Diagnosis not present

## 2019-10-04 DIAGNOSIS — M0589 Other rheumatoid arthritis with rheumatoid factor of multiple sites: Secondary | ICD-10-CM | POA: Diagnosis not present

## 2019-10-04 DIAGNOSIS — M150 Primary generalized (osteo)arthritis: Secondary | ICD-10-CM | POA: Diagnosis not present

## 2019-10-04 DIAGNOSIS — Z6835 Body mass index (BMI) 35.0-35.9, adult: Secondary | ICD-10-CM | POA: Diagnosis not present

## 2019-10-04 DIAGNOSIS — L4050 Arthropathic psoriasis, unspecified: Secondary | ICD-10-CM | POA: Diagnosis not present

## 2019-10-04 DIAGNOSIS — E669 Obesity, unspecified: Secondary | ICD-10-CM | POA: Diagnosis not present

## 2019-10-09 DIAGNOSIS — M25641 Stiffness of right hand, not elsewhere classified: Secondary | ICD-10-CM | POA: Diagnosis not present

## 2019-10-09 DIAGNOSIS — M5412 Radiculopathy, cervical region: Secondary | ICD-10-CM | POA: Diagnosis not present

## 2019-10-12 DIAGNOSIS — M25641 Stiffness of right hand, not elsewhere classified: Secondary | ICD-10-CM | POA: Diagnosis not present

## 2019-10-12 DIAGNOSIS — M5412 Radiculopathy, cervical region: Secondary | ICD-10-CM | POA: Diagnosis not present

## 2019-10-18 DIAGNOSIS — M25641 Stiffness of right hand, not elsewhere classified: Secondary | ICD-10-CM | POA: Diagnosis not present

## 2019-10-18 DIAGNOSIS — M5412 Radiculopathy, cervical region: Secondary | ICD-10-CM | POA: Diagnosis not present

## 2019-10-24 DIAGNOSIS — M5412 Radiculopathy, cervical region: Secondary | ICD-10-CM | POA: Diagnosis not present

## 2019-10-24 DIAGNOSIS — M25641 Stiffness of right hand, not elsewhere classified: Secondary | ICD-10-CM | POA: Diagnosis not present

## 2019-10-26 DIAGNOSIS — M25641 Stiffness of right hand, not elsewhere classified: Secondary | ICD-10-CM | POA: Diagnosis not present

## 2019-10-26 DIAGNOSIS — M5412 Radiculopathy, cervical region: Secondary | ICD-10-CM | POA: Diagnosis not present

## 2019-10-30 DIAGNOSIS — M5412 Radiculopathy, cervical region: Secondary | ICD-10-CM | POA: Diagnosis not present

## 2019-10-30 DIAGNOSIS — M25641 Stiffness of right hand, not elsewhere classified: Secondary | ICD-10-CM | POA: Diagnosis not present

## 2019-11-01 DIAGNOSIS — M5412 Radiculopathy, cervical region: Secondary | ICD-10-CM | POA: Diagnosis not present

## 2019-11-01 DIAGNOSIS — M25641 Stiffness of right hand, not elsewhere classified: Secondary | ICD-10-CM | POA: Diagnosis not present

## 2019-11-02 DIAGNOSIS — Z961 Presence of intraocular lens: Secondary | ICD-10-CM | POA: Diagnosis not present

## 2019-11-02 DIAGNOSIS — H52223 Regular astigmatism, bilateral: Secondary | ICD-10-CM | POA: Diagnosis not present

## 2019-11-02 DIAGNOSIS — H524 Presbyopia: Secondary | ICD-10-CM | POA: Diagnosis not present

## 2019-11-02 DIAGNOSIS — H40023 Open angle with borderline findings, high risk, bilateral: Secondary | ICD-10-CM | POA: Diagnosis not present

## 2019-11-02 DIAGNOSIS — H04123 Dry eye syndrome of bilateral lacrimal glands: Secondary | ICD-10-CM | POA: Diagnosis not present

## 2019-11-02 DIAGNOSIS — M0570 Rheumatoid arthritis with rheumatoid factor of unspecified site without organ or systems involvement: Secondary | ICD-10-CM | POA: Diagnosis not present

## 2019-11-23 DIAGNOSIS — M069 Rheumatoid arthritis, unspecified: Secondary | ICD-10-CM | POA: Diagnosis not present

## 2019-11-23 DIAGNOSIS — G8929 Other chronic pain: Secondary | ICD-10-CM | POA: Diagnosis not present

## 2019-11-23 DIAGNOSIS — F5101 Primary insomnia: Secondary | ICD-10-CM | POA: Diagnosis not present

## 2019-11-23 DIAGNOSIS — E7800 Pure hypercholesterolemia, unspecified: Secondary | ICD-10-CM | POA: Diagnosis not present

## 2019-11-29 DIAGNOSIS — M25641 Stiffness of right hand, not elsewhere classified: Secondary | ICD-10-CM | POA: Diagnosis not present

## 2019-11-29 DIAGNOSIS — M5412 Radiculopathy, cervical region: Secondary | ICD-10-CM | POA: Diagnosis not present

## 2020-05-08 DIAGNOSIS — U071 COVID-19: Secondary | ICD-10-CM | POA: Diagnosis not present

## 2020-05-26 DIAGNOSIS — F5101 Primary insomnia: Secondary | ICD-10-CM | POA: Diagnosis not present

## 2020-05-26 DIAGNOSIS — M069 Rheumatoid arthritis, unspecified: Secondary | ICD-10-CM | POA: Diagnosis not present

## 2020-05-26 DIAGNOSIS — Z79899 Other long term (current) drug therapy: Secondary | ICD-10-CM | POA: Diagnosis not present

## 2020-05-26 DIAGNOSIS — E7800 Pure hypercholesterolemia, unspecified: Secondary | ICD-10-CM | POA: Diagnosis not present

## 2020-05-26 DIAGNOSIS — M170 Bilateral primary osteoarthritis of knee: Secondary | ICD-10-CM | POA: Diagnosis not present

## 2020-05-26 DIAGNOSIS — Z23 Encounter for immunization: Secondary | ICD-10-CM | POA: Diagnosis not present

## 2020-05-26 DIAGNOSIS — Z0001 Encounter for general adult medical examination with abnormal findings: Secondary | ICD-10-CM | POA: Diagnosis not present

## 2020-05-26 DIAGNOSIS — E2839 Other primary ovarian failure: Secondary | ICD-10-CM | POA: Diagnosis not present

## 2020-05-28 ENCOUNTER — Other Ambulatory Visit: Payer: Self-pay | Admitting: Family Medicine

## 2020-05-28 DIAGNOSIS — Z1231 Encounter for screening mammogram for malignant neoplasm of breast: Secondary | ICD-10-CM

## 2020-05-28 DIAGNOSIS — E2839 Other primary ovarian failure: Secondary | ICD-10-CM

## 2020-06-16 ENCOUNTER — Other Ambulatory Visit: Payer: Self-pay | Admitting: Family Medicine

## 2020-06-16 DIAGNOSIS — N63 Unspecified lump in unspecified breast: Secondary | ICD-10-CM

## 2020-07-03 ENCOUNTER — Other Ambulatory Visit: Payer: Self-pay | Admitting: Family Medicine

## 2020-07-03 DIAGNOSIS — N63 Unspecified lump in unspecified breast: Secondary | ICD-10-CM

## 2020-08-20 ENCOUNTER — Ambulatory Visit
Admission: RE | Admit: 2020-08-20 | Discharge: 2020-08-20 | Disposition: A | Discharge status: Home / Self Care | Payer: Medicare Other | Source: Ambulatory Visit | Attending: Family Medicine | Admitting: Family Medicine

## 2020-08-20 ENCOUNTER — Other Ambulatory Visit: Payer: Self-pay

## 2020-08-20 DIAGNOSIS — N63 Unspecified lump in unspecified breast: Secondary | ICD-10-CM

## 2020-08-20 DIAGNOSIS — R922 Inconclusive mammogram: Secondary | ICD-10-CM | POA: Diagnosis not present

## 2020-10-14 DIAGNOSIS — M19041 Primary osteoarthritis, right hand: Secondary | ICD-10-CM | POA: Diagnosis not present

## 2020-11-07 DIAGNOSIS — M4692 Unspecified inflammatory spondylopathy, cervical region: Secondary | ICD-10-CM | POA: Diagnosis not present

## 2020-11-07 DIAGNOSIS — M542 Cervicalgia: Secondary | ICD-10-CM | POA: Diagnosis not present

## 2020-11-14 DIAGNOSIS — M47812 Spondylosis without myelopathy or radiculopathy, cervical region: Secondary | ICD-10-CM | POA: Diagnosis not present

## 2020-11-27 ENCOUNTER — Other Ambulatory Visit: Payer: Self-pay

## 2020-12-09 DIAGNOSIS — M47812 Spondylosis without myelopathy or radiculopathy, cervical region: Secondary | ICD-10-CM | POA: Diagnosis not present

## 2020-12-30 DIAGNOSIS — M47812 Spondylosis without myelopathy or radiculopathy, cervical region: Secondary | ICD-10-CM | POA: Diagnosis not present

## 2021-01-23 DIAGNOSIS — M170 Bilateral primary osteoarthritis of knee: Secondary | ICD-10-CM | POA: Diagnosis not present

## 2021-01-23 DIAGNOSIS — F5101 Primary insomnia: Secondary | ICD-10-CM | POA: Diagnosis not present

## 2021-01-23 DIAGNOSIS — Z1211 Encounter for screening for malignant neoplasm of colon: Secondary | ICD-10-CM | POA: Diagnosis not present

## 2021-01-23 DIAGNOSIS — E7800 Pure hypercholesterolemia, unspecified: Secondary | ICD-10-CM | POA: Diagnosis not present

## 2021-01-23 DIAGNOSIS — G8929 Other chronic pain: Secondary | ICD-10-CM | POA: Diagnosis not present

## 2021-01-23 DIAGNOSIS — M069 Rheumatoid arthritis, unspecified: Secondary | ICD-10-CM | POA: Diagnosis not present

## 2021-01-23 DIAGNOSIS — Z79899 Other long term (current) drug therapy: Secondary | ICD-10-CM | POA: Diagnosis not present

## 2021-02-11 DIAGNOSIS — N814 Uterovaginal prolapse, unspecified: Secondary | ICD-10-CM | POA: Diagnosis not present

## 2021-02-11 DIAGNOSIS — Z780 Asymptomatic menopausal state: Secondary | ICD-10-CM | POA: Diagnosis not present

## 2021-02-11 DIAGNOSIS — Z01419 Encounter for gynecological examination (general) (routine) without abnormal findings: Secondary | ICD-10-CM | POA: Diagnosis not present

## 2021-02-16 ENCOUNTER — Other Ambulatory Visit: Payer: Self-pay | Admitting: Nurse Practitioner

## 2021-02-16 DIAGNOSIS — Z780 Asymptomatic menopausal state: Secondary | ICD-10-CM

## 2021-03-25 DIAGNOSIS — L659 Nonscarring hair loss, unspecified: Secondary | ICD-10-CM | POA: Diagnosis not present

## 2021-04-10 DIAGNOSIS — L731 Pseudofolliculitis barbae: Secondary | ICD-10-CM | POA: Diagnosis not present

## 2021-07-09 ENCOUNTER — Ambulatory Visit: Payer: PRIVATE HEALTH INSURANCE | Admitting: Podiatry

## 2021-08-25 ENCOUNTER — Ambulatory Visit (INDEPENDENT_AMBULATORY_CARE_PROVIDER_SITE_OTHER): Payer: Medicare HMO

## 2021-08-25 ENCOUNTER — Ambulatory Visit (INDEPENDENT_AMBULATORY_CARE_PROVIDER_SITE_OTHER): Payer: Medicare HMO | Admitting: Podiatry

## 2021-08-25 DIAGNOSIS — M19071 Primary osteoarthritis, right ankle and foot: Secondary | ICD-10-CM

## 2021-08-25 DIAGNOSIS — M79671 Pain in right foot: Secondary | ICD-10-CM | POA: Diagnosis not present

## 2021-08-25 DIAGNOSIS — M7751 Other enthesopathy of right foot: Secondary | ICD-10-CM

## 2021-08-25 MED ORDER — TRIAMCINOLONE ACETONIDE 10 MG/ML IJ SUSP: 10.0000 mg | Freq: Once | INTRAMUSCULAR | Status: AC

## 2021-08-25 NOTE — Patient Instructions (Signed)

## 2021-08-27 ENCOUNTER — Telehealth: Payer: Self-pay | Admitting: *Deleted

## 2021-08-27 NOTE — Telephone Encounter (Signed)
Patient is calling for name of her diagnosis on visit.  Returned the call, no answer, left voice message-Capsulitis right ankle

## 2021-09-01 NOTE — Progress Notes (Signed)
Subjective:   Patient ID: Raliegh Ip, female   DOB: 71 y.o.   MRN: [id number]   HPI Chief Complaint  Patient presents with   Foot Pain    RIGHT FOOT AND ANKLE PAIN, RATE OF PAIN 4 OUT 10, ACHE, THE MEDIAL SIDE OF THE ANKLE, SURG ON THE ANKLE 1983, X-RAYS DONE TODAY  [id number]   71 year old female presents the office with above complaints.  She has a history of right ankle fracture and surgery 1983.  She has aching sensation mostly in the medial aspect of the ankle.  No recent injuries that she reports.  She not had any recent treatment.  No other concerns.    Review of Systems  All other systems reviewed and are negative.  No past medical history on file.   Current Outpatient Medications:    metFORMIN (GLUCOPHAGE) 500 MG tablet, Take 500 mg by mouth 2 (two) times daily., Disp: , Rfl:    pregabalin (LYRICA) 150 MG capsule, Take 150 mg by mouth 2 (two) times daily., Disp: , Rfl:   Current Facility-Administered Medications:    triamcinolone acetonide (KENALOG) 10 MG/ML injection 10 mg, 10 mg, Other, Once, Trula Slade, DPM  Not on File         Objective:  Physical Exam  General: AAO x3, NAD  Dermatological: Skin is warm, dry and supple bilateral.There are no open sores, no preulcerative lesions, no rash or signs of infection present.  Vascular: Dorsalis Pedis artery and Posterior Tibial artery pedal pulses are 2/4 bilateral with immedate capillary fill time.  There is no pain with calf compression, swelling, warmth, erythema.   Neruologic: Grossly intact via light touch bilateral.   Musculoskeletal: There is tenderness palpation on the right anterior ankle joint on the right side.  There is no crepitation with range of motion.  There is decreased range of motion of subtalar joint.  There is mild edema there is no erythema or warmth.  Muscular strength 5/5 in all groups tested bilateral.  Gait: Unassisted, Nonantalgic.       Assessment:   Capsulitis,  arthritis     Plan:  -Treatment options discussed including all alternatives, risks, and complications. -Etiology of symptoms were discussed -X-rays were obtained and reviewed with the patient.  2 views of the right ankle, 3 views of the right foot were obtained.  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  Previous fracture noted of the fibula. -Steroid injection was performed on the ankle.  Skin was prepped with Betadine, alcohol mixture 1 cc Kenalog 10, 0.5 cc Marcaine plain, 0.5 cc of lidocaine plain was infiltrated into the area of maximal tenderness without complications.  Postinjection care discussed. -Bracing as needed.  Continue shoes and good arch support.  Anti-inflammatories as needed  Trula Slade DPM

## 2021-09-08 ENCOUNTER — Other Ambulatory Visit: Payer: Self-pay | Admitting: Podiatry

## 2021-09-08 DIAGNOSIS — M19071 Primary osteoarthritis, right ankle and foot: Secondary | ICD-10-CM

## 2022-05-13 IMAGING — MG DIGITAL DIAGNOSTIC BILAT W/ TOMO W/ CAD
6 of 10 series · 6 of 30 positions shown · non-contrast
Comparison: None.

CLINICAL DATA: 70-year-old female presenting with a lump in the far
medial right breast which has been present for approximately 1 year
without significant change.

EXAM:
DIGITAL DIAGNOSTIC BILATERAL MAMMOGRAM WITH TOMOSYNTHESIS AND CAD;
ULTRASOUND RIGHT BREAST LIMITED
TECHNIQUE: Bilateral digital diagnostic mammography and breast tomosynthesis
was performed. The images were evaluated with computer-aided
detection.; Targeted ultrasound examination of the right breast was
performed

[R CC synth-2D (1 of 2)]
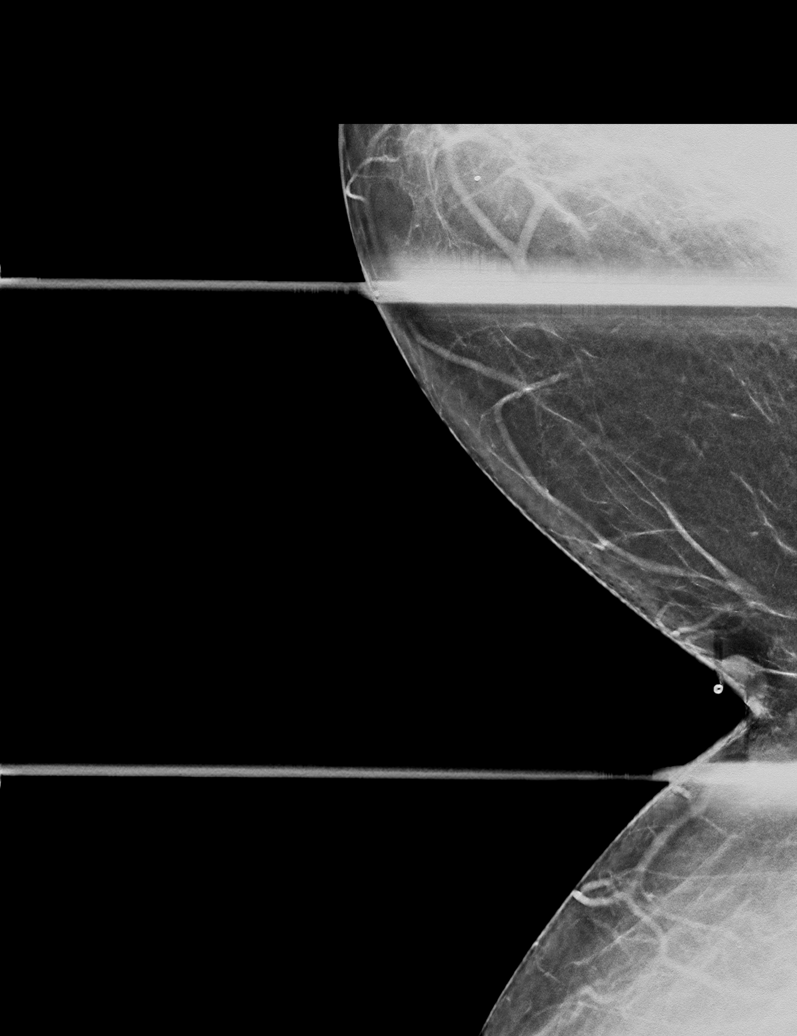

[L CC synth-2D]
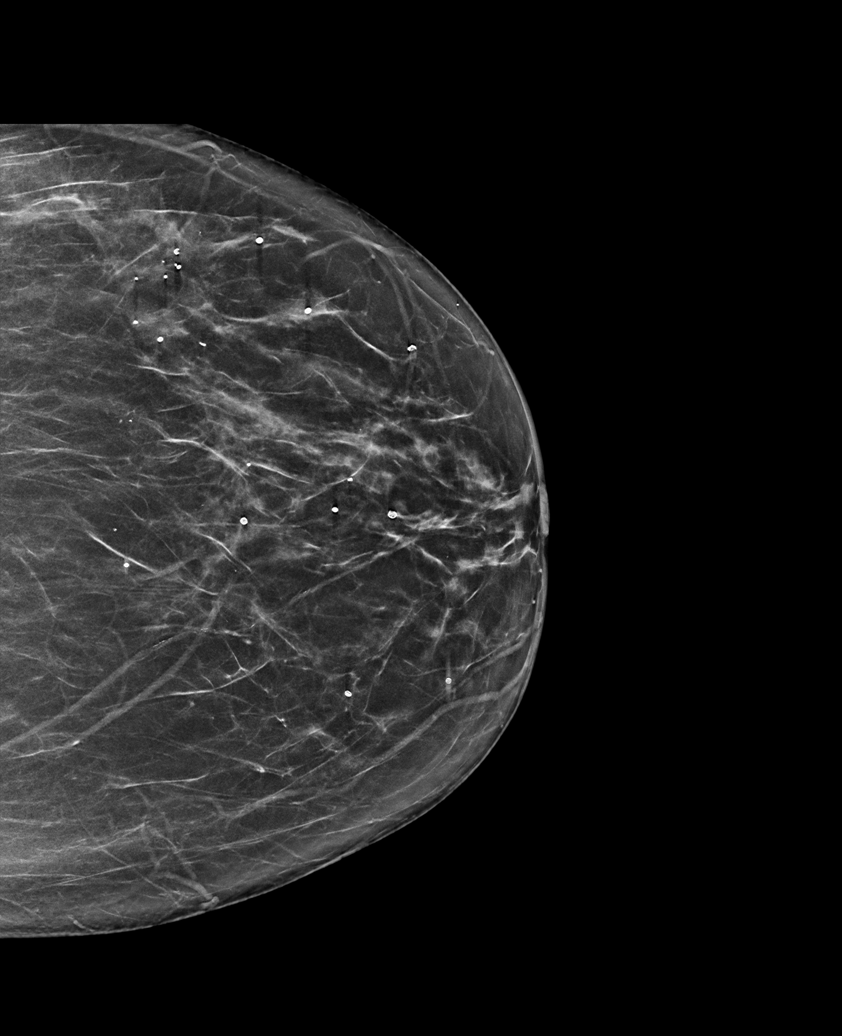

[R CC synth-2D (2 of 2)]
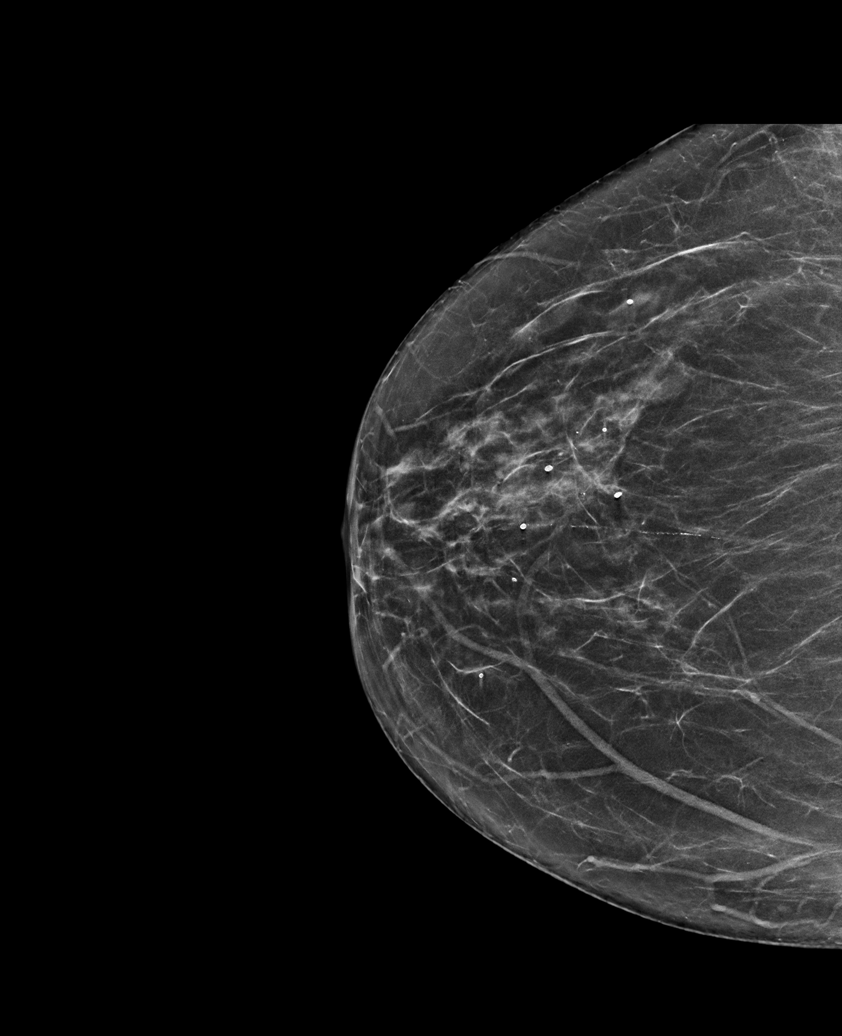

[L MLO synth-2D]
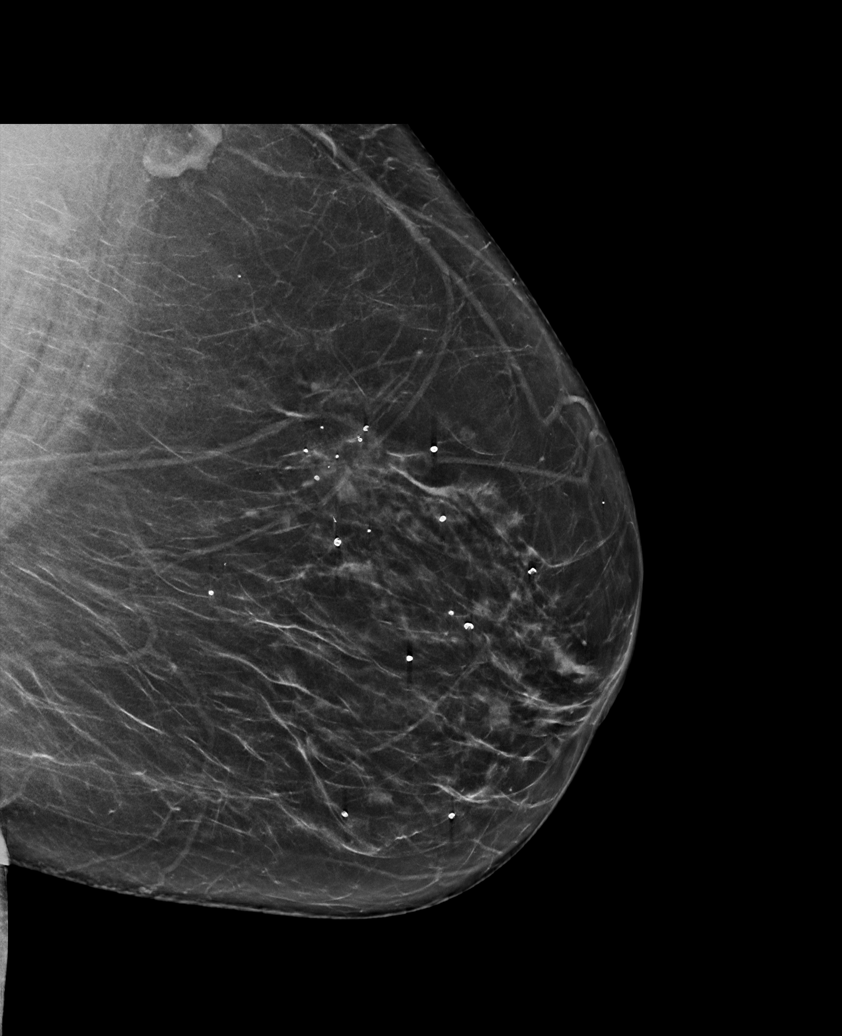

[R MLO synth-2D]
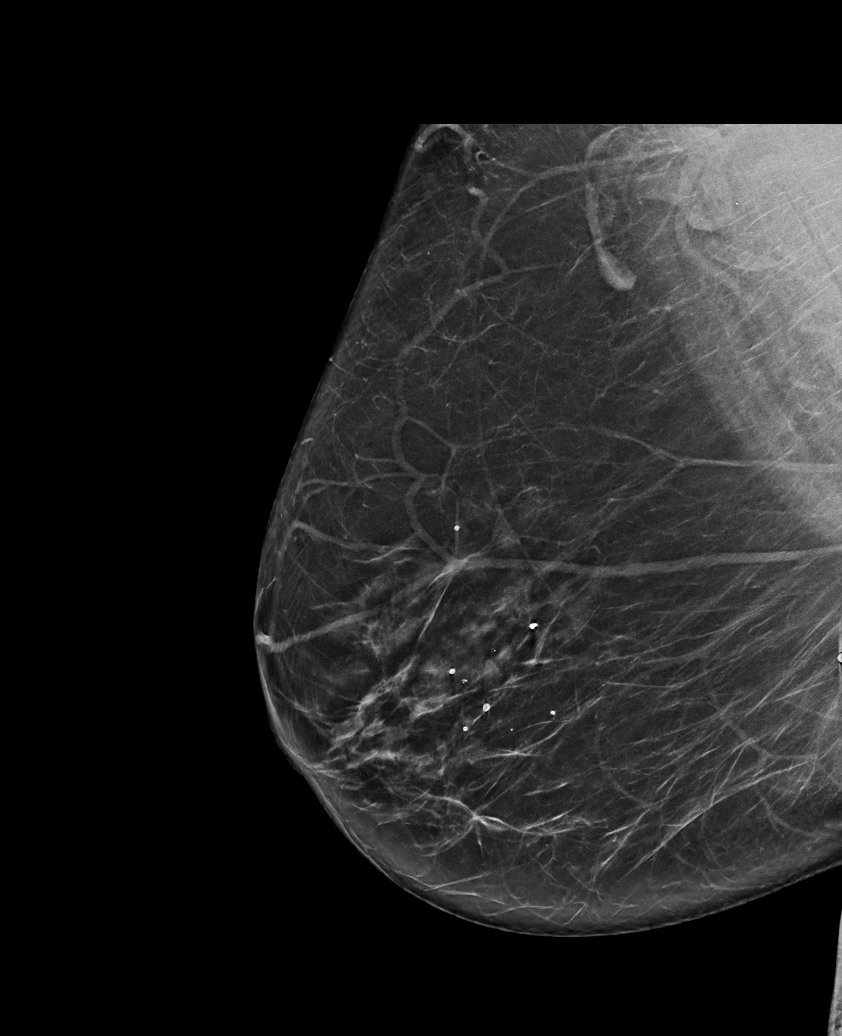

[R CC tomo · tomo slice 33/65.0]
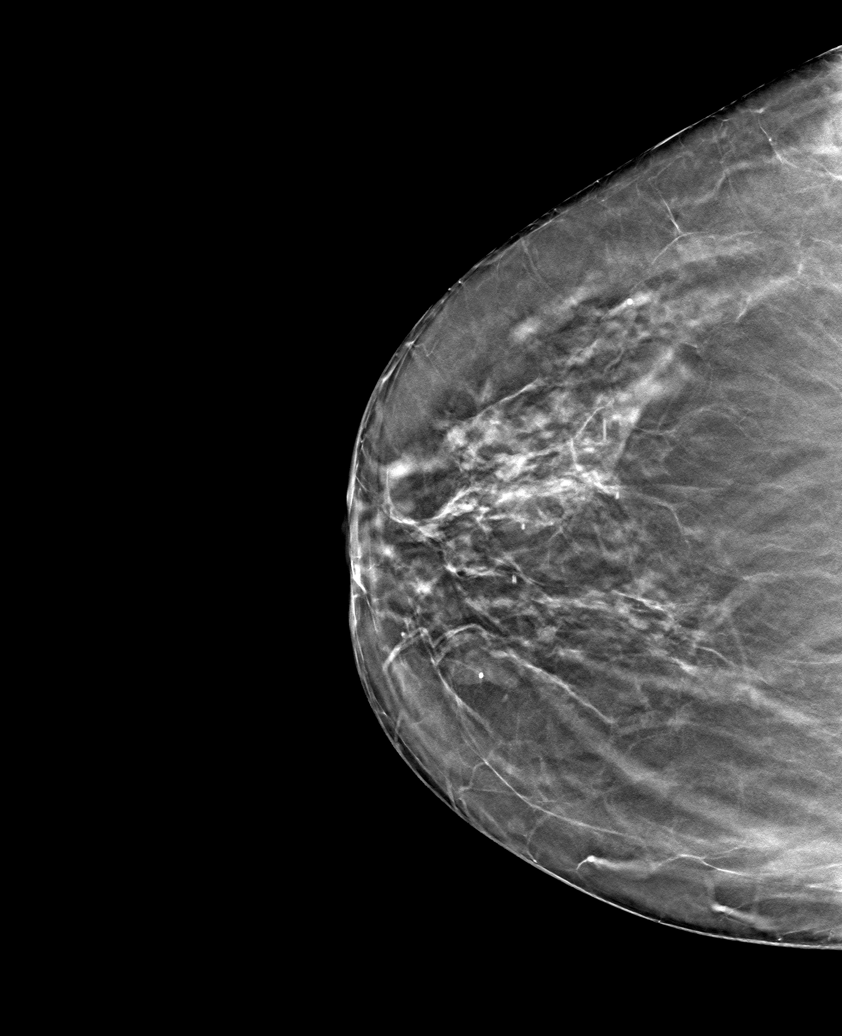

[6 of 30 positions shown; findings below may reference images not displayed]

ACR Breast Density Category b: There are scattered areas of
fibroglandular density.
FINDINGS: Mammogram:

Right breast: A skin BB marks the site of concern reported by the
patient along the far medial right breast. A spot tangential view of
this area is performed in addition to standard views. There is a
superficial oval circumscribed mass at the palpable site measuring
approximately 1.1 cm. There are no suspicious findings elsewhere in
the right breast.

Left breast: No suspicious mass, distortion, or microcalcifications
are identified to suggest presence of malignancy.

On physical exam I feel a small superficial skin nodule at the
palpable site of concern.

Ultrasound:

Targeted ultrasound performed at the palpable site of concern in the
right breast at 3 o'clock 13 cm from the nipple demonstrating a
subdermal oval circumscribed mass measuring 1.1 x 0.6 cm, with a
pore to the skin surface. This is consistent with a benign sebaceous
cyst. Targeted ultrasound the right axilla demonstrates normal lymph
nodes.
IMPRESSION: 1. At the palpable site of concern in the far medial right breast
there is a benign skin lesion, most likely a sebaceous cyst.

2.  No mammographic evidence of malignancy bilaterally.

RECOMMENDATION:
Screening mammogram in one year.(Code:[id number])

I have discussed the findings and recommendations with the patient.
If applicable, a reminder letter will be sent to the patient
regarding the next appointment.

BI-RADS CATEGORY  2: Benign.

## 2022-05-20 ENCOUNTER — Telehealth: Payer: Self-pay | Admitting: Podiatry

## 2022-05-20 NOTE — Telephone Encounter (Signed)
Pt left message yesterday 5.8 @ 508pm stating she needed to change her appt for tomorrow at 1115am. She is having family come in town and will be busy. She stated she will probably need an injection.  I returned call and left message for pt that as of now Dr Ardelle Anton does not have any earlier appts for tomorrow but we can see what we can do when she calls back.

## 2022-05-21 ENCOUNTER — Ambulatory Visit: Payer: Medicare HMO | Admitting: Podiatry

## 2022-05-21 DIAGNOSIS — B351 Tinea unguium: Secondary | ICD-10-CM | POA: Diagnosis not present

## 2022-05-21 DIAGNOSIS — M7751 Other enthesopathy of right foot: Secondary | ICD-10-CM | POA: Diagnosis not present

## 2022-05-21 DIAGNOSIS — Z79899 Other long term (current) drug therapy: Secondary | ICD-10-CM

## 2022-05-21 MED ORDER — TRIAMCINOLONE ACETONIDE 10 MG/ML IJ SUSP: 10.0000 mg | Freq: Once | INTRAMUSCULAR | Status: AC

## 2022-05-21 NOTE — Patient Instructions (Signed)
Terbinafine Tablets What is this medication? TERBINAFINE (TER bin a feen) treats fungal infections of the nails. It belongs to a group of medications called antifungals. It will not treat infections caused by bacteria or viruses. This medicine may be used for other purposes; ask your health care provider or pharmacist if you have questions. COMMON BRAND NAME(S): Lamisil, Terbinex What should I tell my care team before I take this medication? They need to know if you have any of these conditions: Liver disease An unusual or allergic reaction to terbinafine, other medications, foods, dyes, or preservatives Pregnant or trying to get pregnant Breast-feeding How should I use this medication? Take this medication by mouth with water. Take it as directed on the prescription label at the same time every day. You can take it with or without food. If it upsets your stomach, take it with food. Keep taking it unless your care team tells you to stop. A special MedGuide will be given to you by the pharmacist with each prescription and refill. Be sure to read this information carefully each time. Talk to your care team regarding the use of this medication in children. Special care may be needed. Overdosage: If you think you have taken too much of this medicine contact a poison control center or emergency room at once. NOTE: This medicine is only for you. Do not share this medicine with others. What if I miss a dose? If you miss a dose, take it as soon as you can unless it is more than 4 hours late. If it is more than 4 hours late, skip the missed dose. Take the next dose at the normal time. What may interact with this medication? Do not take this medication with any of the following: Pimozide Thioridazine This medication may also interact with the following: Beta blockers Caffeine Certain medications for mental health conditions Cimetidine Cyclosporine Medications for fungal infections like fluconazole  and ketoconazole Medications for irregular heartbeat like amiodarone, flecainide and propafenone Rifampin Warfarin This list may not describe all possible interactions. Give your health care provider a list of all the medicines, herbs, non-prescription drugs, or dietary supplements you use. Also tell them if you smoke, drink alcohol, or use illegal drugs. Some items may interact with your medicine. What should I watch for while using this medication? Visit your care team for regular checks on your progress. You may need blood work while you are taking this medication. It may be some time before you see the benefit from this medication. This medication may cause serious skin reactions. They can happen weeks to months after starting the medication. Contact your care team right away if you notice fevers or flu-like symptoms with a rash. The rash may be red or purple and then turn into blisters or peeling of the skin. Or, you might notice a red rash with swelling of the face, lips or lymph nodes in your neck or under your arms. This medication can make you more sensitive to the sun. Keep out of the sun, If you cannot avoid being in the sun, wear protective clothing and sunscreen. Do not use sun lamps or tanning beds/booths. What side effects may I notice from receiving this medication? Side effects that you should report to your care team as soon as possible: Allergic reactions--skin rash, itching, hives, swelling of the face, lips, tongue, or throat Change in sense of smell Change in taste Infection--fever, chills, cough, or sore throat Liver injury--right upper belly pain, loss of appetite, nausea,   light-colored stool, dark yellow or brown urine, yellowing skin or eyes, unusual weakness or fatigue Low red blood cell level--unusual weakness or fatigue, dizziness, headache, trouble breathing Lupus-like syndrome--joint pain, swelling, or stiffness, butterfly-shaped rash on the face, rashes that get worse  in the sun, fever, unusual weakness or fatigue Rash, fever, and swollen lymph nodes Redness, blistering, peeling, or loosening of the skin, including inside the mouth Unusual bruising or bleeding Worsening mood, feelings of depression Side effects that usually do not require medical attention (report to your care team if they continue or are bothersome): Diarrhea Gas Headache Nausea Stomach pain Upset stomach This list may not describe all possible side effects. Call your doctor for medical advice about side effects. You may report side effects to FDA at 1-800-FDA-1088. Where should I keep my medication? Keep out of the reach of children and pets. Store between 20 and 25 degrees C (68 and 77 degrees F). Protect from light. Get rid of any unused medication after the expiration date. To get rid of medications that are no longer needed or have expired: Take the medication to a medication take-back program. Check with your pharmacy or law enforcement to find a location. If you cannot return the medication, check the label or package insert to see if the medication should be thrown out in the garbage or flushed down the toilet. If you are not sure, ask your care team. If it is safe to put it in the trash, take the medication out of the container. Mix the medication with cat litter, dirt, coffee grounds, or other unwanted substance. Seal the mixture in a bag or container. Put it in the trash. NOTE: This sheet is a summary. It may not cover all possible information. If you have questions about this medicine, talk to your doctor, pharmacist, or health care provider.  2023 Elsevier/Gold Standard (2020-07-22 00:00:00)  

## 2022-05-22 LAB — CBC WITH DIFFERENTIAL/PLATELET
Basophils Absolute: 0.1 10*3/uL (ref 0.0–0.2)
Basos: 1 %
EOS (ABSOLUTE): 0.1 10*3/uL (ref 0.0–0.4)
Eos: 1 %
Hematocrit: 39.6 % (ref 34.0–46.6)
Hemoglobin: 13 g/dL (ref 11.1–15.9)
Immature Grans (Abs): 0 10*3/uL (ref 0.0–0.1)
Immature Granulocytes: 0 %
Lymphocytes Absolute: 2.7 10*3/uL (ref 0.7–3.1)
Lymphs: 28 %
MCH: 29.8 pg (ref 26.6–33.0)
MCHC: 32.8 g/dL (ref 31.5–35.7)
MCV: 91 fL (ref 79–97)
Monocytes Absolute: 1 10*3/uL — ABNORMAL HIGH (ref 0.1–0.9)
Monocytes: 11 %
Neutrophils Absolute: 5.7 10*3/uL (ref 1.4–7.0)
Neutrophils: 59 %
Platelets: 312 10*3/uL (ref 150–450)
RBC: 4.36 x10E6/uL (ref 3.77–5.28)
RDW: 13.1 % (ref 11.7–15.4)
WBC: 9.6 10*3/uL (ref 3.4–10.8)

## 2022-05-22 LAB — HEPATIC FUNCTION PANEL
ALT: 9 IU/L (ref 0–32)
AST: 14 IU/L (ref 0–40)
Albumin: 4.3 g/dL (ref 3.9–4.9)
Alkaline Phosphatase: 99 IU/L (ref 44–121)
Bilirubin Total: 0.3 mg/dL (ref 0.0–1.2)
Bilirubin, Direct: <0.1 mg/dL (ref 0.00–0.40)
Total Protein: 6.7 g/dL (ref 6.0–8.5)

## 2022-05-26 ENCOUNTER — Other Ambulatory Visit: Payer: Self-pay | Admitting: Podiatry

## 2022-05-26 DIAGNOSIS — Z79899 Other long term (current) drug therapy: Secondary | ICD-10-CM

## 2022-05-26 MED ORDER — TERBINAFINE HCL 250 MG PO TABS: 250.0000 mg | ORAL_TABLET | Freq: Every day | ORAL | 0 refills | Status: AC

## 2022-05-29 NOTE — Progress Notes (Signed)
Subjective:   Patient ID: Tonya Burton, female   DOB: 72 y.o.   MRN: [id number]   HPI Chief Complaint  Patient presents with   Arthritis    R foot pain, injection    72 year old female presents the office with above complaints.  She states her last injection was helping however she would like another injection as the pain started coming back recently.  She also has concerns about nail fungus.  No pain in the nails.  No open lesions.        Objective:  Physical Exam  General: AAO x3, NAD  Dermatological: Nails are hypertrophic, dystrophic with yellow, brown discoloration.  No edema, erythema or signs of infection.  No open lesions.  Vascular: Dorsalis Pedis artery and Posterior Tibial artery pedal pulses are 2/4 bilateral with immedate capillary fill time.  There is no pain with calf compression, swelling, warmth, erythema.   Neruologic: Grossly intact via light touch bilateral.   Musculoskeletal: There is tenderness palpation on the right anterior ankle joint on the right side.  There is no crepitation with range of motion.  There is decreased range of motion of subtalar joint.  There is mild edema there is no erythema or warmth.  Muscular strength 5/5 in all groups tested bilateral.  Gait: Unassisted, Nonantalgic.       Assessment:   Capsulitis, arthritis; onychomycosis     Plan:  -Treatment options discussed including all alternatives, risks, and complications. -Steroid injection was performed on the ankle.  Skin was prepped with Betadine, alcohol mixture 1 cc Kenalog 10, 0.5 cc Marcaine plain, 0.5 cc of lidocaine plain was infiltrated into the area of maximal tenderness without complications.  Postinjection care discussed. -Bracing as needed.  Continue shoes and good arch support.  Anti-inflammatories as needed -Discussed treatment options for nail fungus including oral, topical as well as alternative treatments.  At this time the patient wants to proceed with oral  Lamisil.  We discussed side effects of the medication and success rates.  We will check a CBC and LFT prior to starting the medication.  Once I receive the results of this I will then call the medication in.    Vivi Barrack DPM

## 2022-08-22 ENCOUNTER — Other Ambulatory Visit: Payer: Self-pay | Admitting: Podiatry

## 2022-10-05 ENCOUNTER — Other Ambulatory Visit: Payer: Self-pay | Admitting: Nurse Practitioner

## 2022-10-05 DIAGNOSIS — R413 Other amnesia: Secondary | ICD-10-CM

## 2022-11-21 ENCOUNTER — Other Ambulatory Visit: Payer: Medicare HMO

## 2023-01-10 ENCOUNTER — Encounter: Payer: Self-pay | Admitting: Family Medicine

## 2023-01-10 ENCOUNTER — Other Ambulatory Visit: Payer: Self-pay | Admitting: Nurse Practitioner

## 2023-01-10 DIAGNOSIS — Z1231 Encounter for screening mammogram for malignant neoplasm of breast: Secondary | ICD-10-CM

## 2023-02-23 ENCOUNTER — Ambulatory Visit: Payer: Self-pay | Admitting: Dermatology

## 2023-07-05 ENCOUNTER — Other Ambulatory Visit (HOSPITAL_COMMUNITY): Payer: Self-pay | Admitting: Physical Medicine and Rehabilitation

## 2023-07-05 DIAGNOSIS — R937 Abnormal findings on diagnostic imaging of other parts of musculoskeletal system: Secondary | ICD-10-CM

## 2023-07-13 ENCOUNTER — Encounter (HOSPITAL_COMMUNITY)
Admission: RE | Admit: 2023-07-13 | Discharge: 2023-07-13 | Disposition: A | Discharge status: Home / Self Care | Source: Ambulatory Visit | Attending: Physical Medicine and Rehabilitation | Admitting: Physical Medicine and Rehabilitation

## 2023-07-13 DIAGNOSIS — R937 Abnormal findings on diagnostic imaging of other parts of musculoskeletal system: Secondary | ICD-10-CM | POA: Diagnosis present

## 2023-07-13 MED ORDER — TECHNETIUM TC 99M MEDRONATE IV KIT
20.0000 | PACK | Freq: Once | INTRAVENOUS | Status: AC | PRN
  Administered 2023-07-13: 20 via INTRAVENOUS
